# Patient Record
Sex: Female | Born: 1975 | Hispanic: No | Smoking: Never smoker
Health system: Southern US, Community
[De-identification: ages and names within clinical notes are randomized; demographics above are authoritative.]

## PROBLEM LIST (undated history)

## (undated) DIAGNOSIS — E119 Type 2 diabetes mellitus without complications: Secondary | ICD-10-CM

## (undated) HISTORY — DX: Type 2 diabetes mellitus without complications: E11.9

## (undated) HISTORY — PX: GALLBLADDER SURGERY: SHX652

---

## 2008-12-31 ENCOUNTER — Ambulatory Visit (HOSPITAL_COMMUNITY): Admission: RE | Admit: 2008-12-31 | Discharge: 2008-12-31 | Payer: Self-pay | Admitting: Obstetrics and Gynecology

## 2009-01-18 ENCOUNTER — Ambulatory Visit (HOSPITAL_COMMUNITY): Admission: RE | Admit: 2009-01-18 | Discharge: 2009-01-18 | Payer: Self-pay | Admitting: Obstetrics and Gynecology

## 2009-02-16 ENCOUNTER — Ambulatory Visit (HOSPITAL_COMMUNITY): Admission: RE | Admit: 2009-02-16 | Discharge: 2009-02-16 | Payer: Self-pay | Admitting: Obstetrics and Gynecology

## 2009-09-03 IMAGING — US US OB COMP +14 WK
1 series · 14 of 28 positions shown · non-contrast
Comparison: none

OBSTETRICAL ULTRASOUND:
 This ultrasound was performed in The [HOSPITAL], and the [facility] report will be stored to [REDACTED] PACS.

[Series 1: us ob comp +14 wk · 43 acquisitions, 14 frames shown]
[im 2/43]
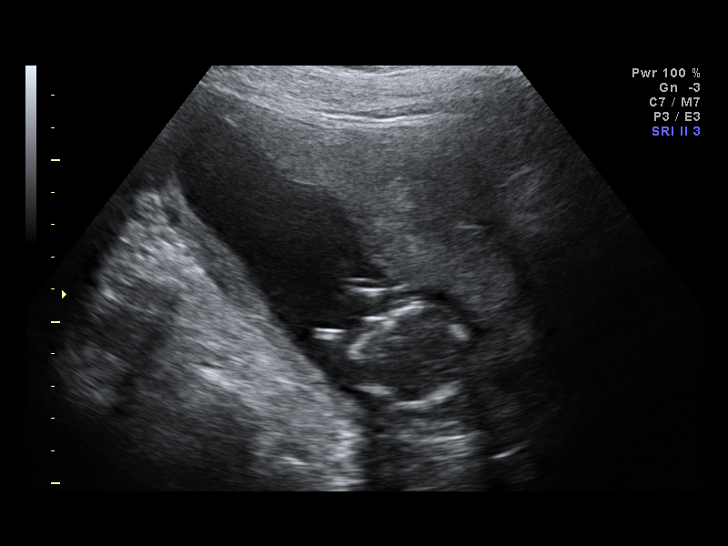
[im 5/43]
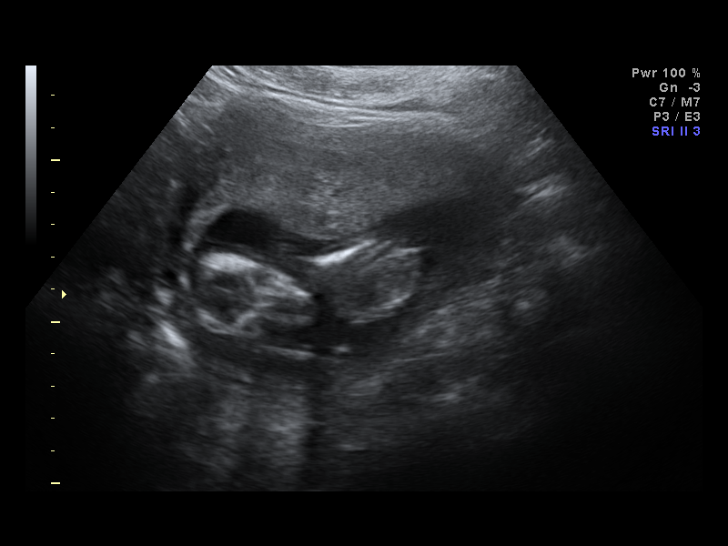
[im 8/43]
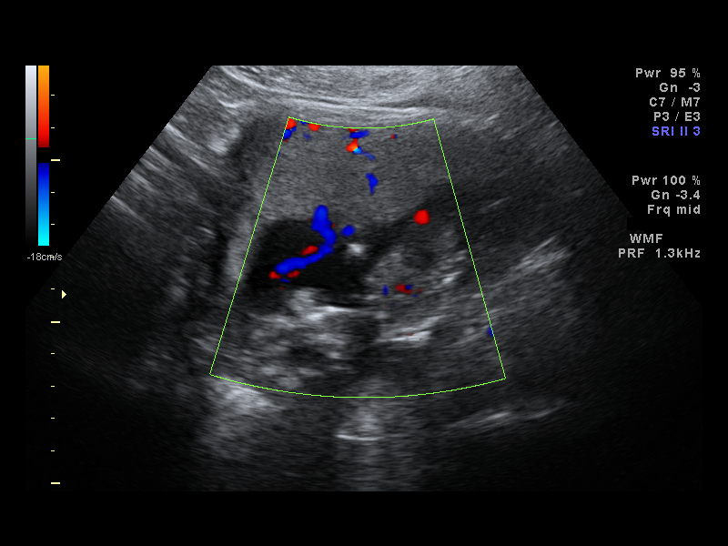
[im 11/43]
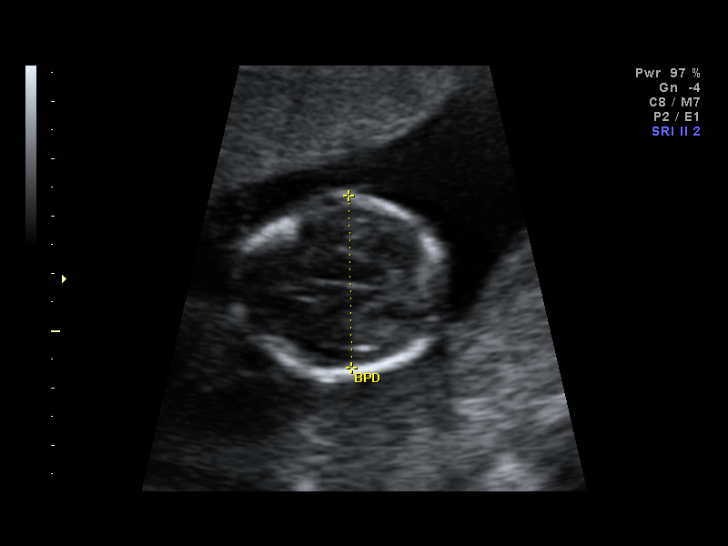
[im 15/43]
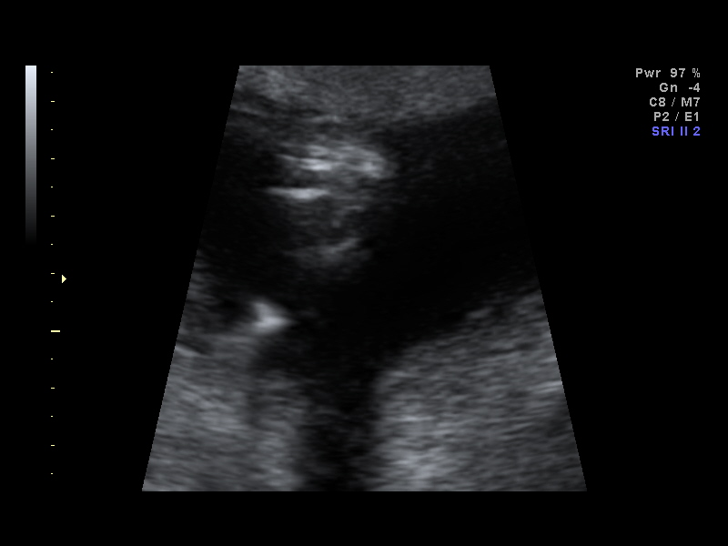
[im 18/43]
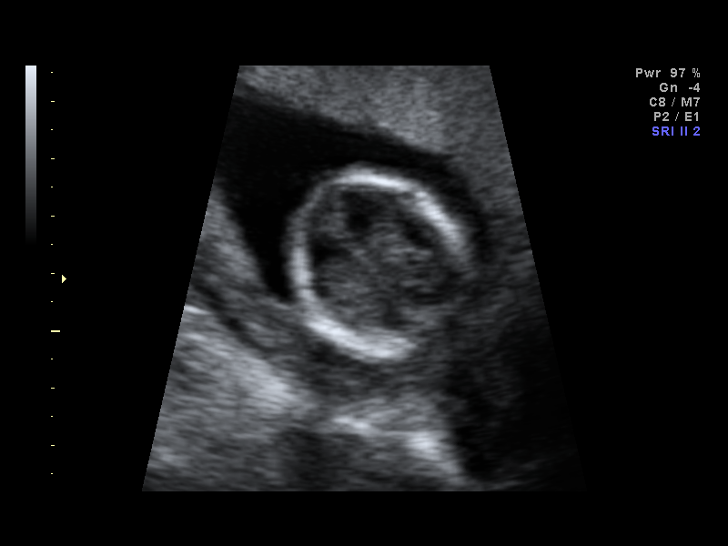
[im 21/43]
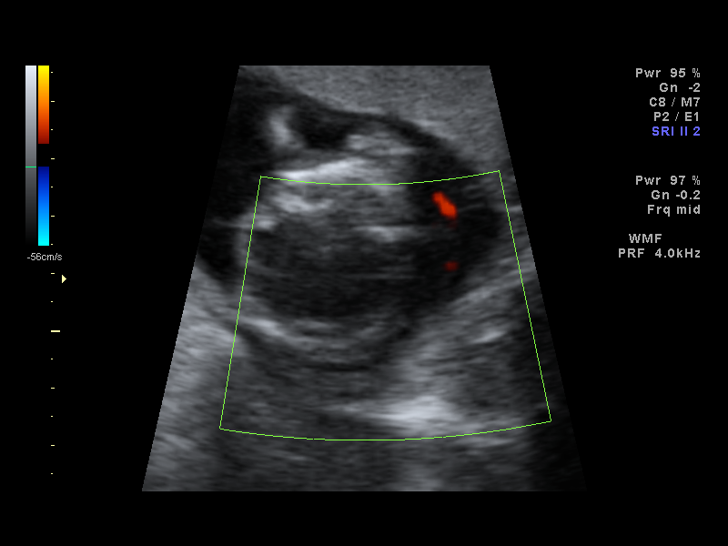
[im 24/43]
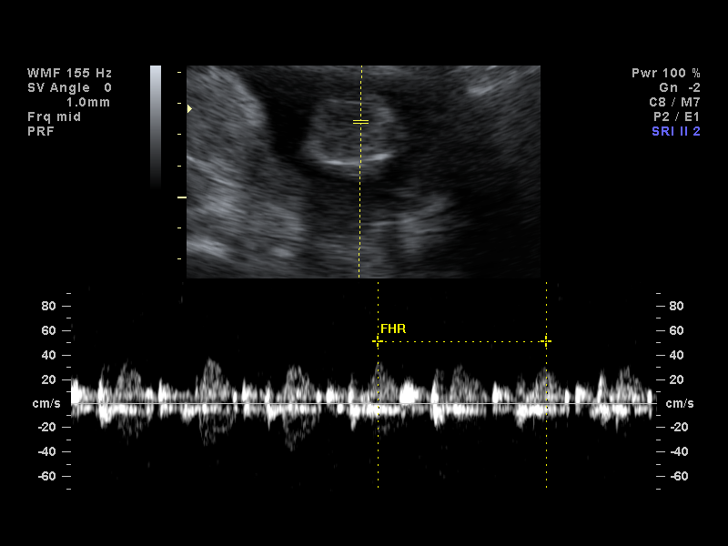
[im 27/43]
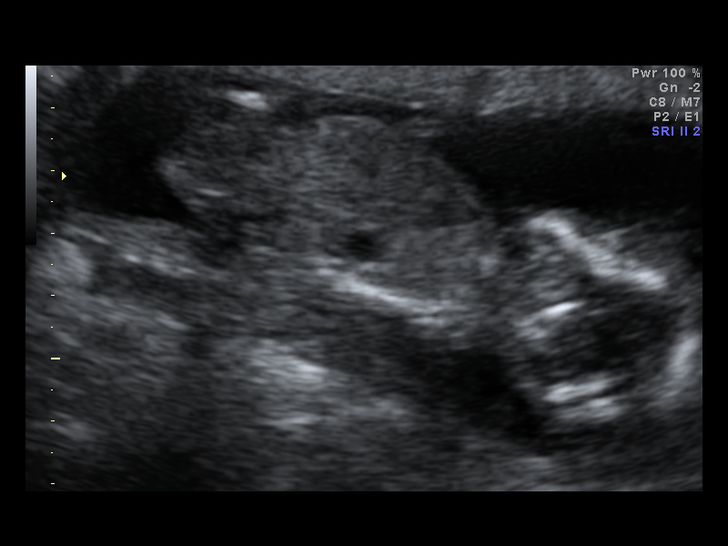
[im 30/43]
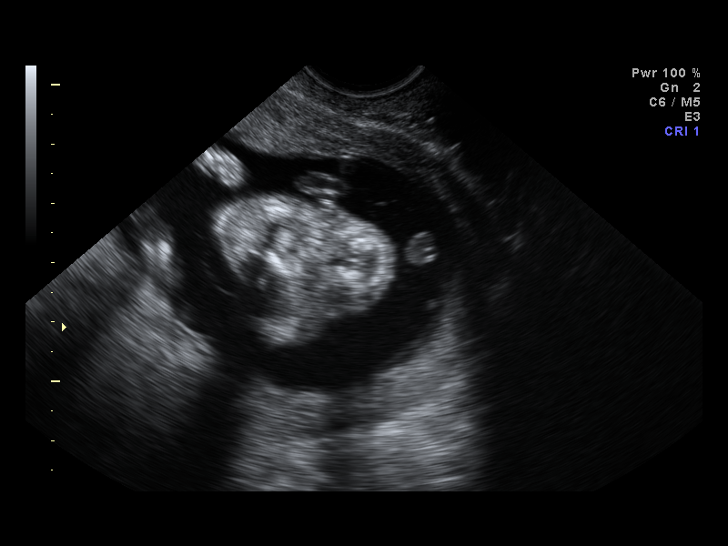
[im 33/43]
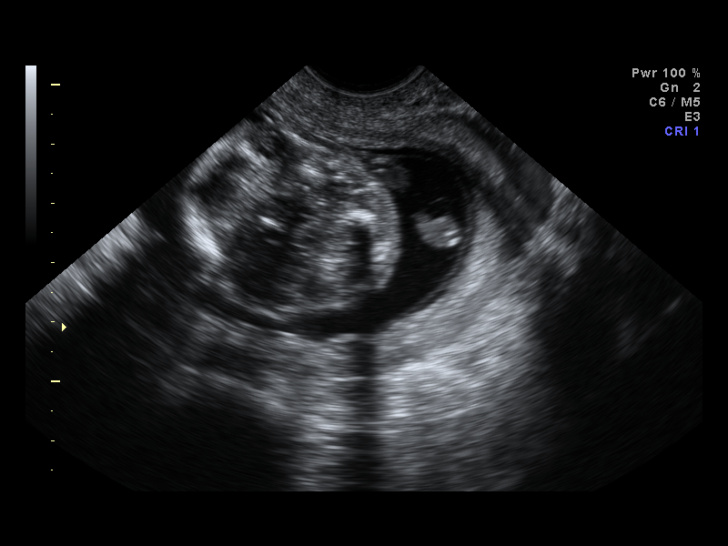
[im 36/43]
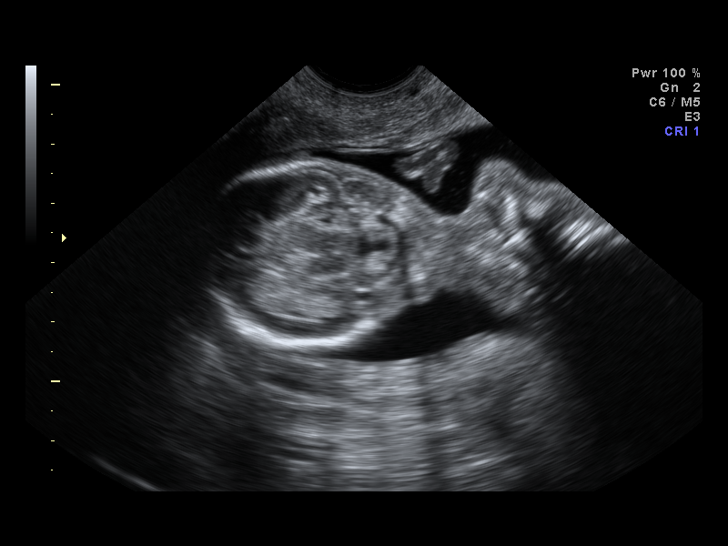
[im 39/43]
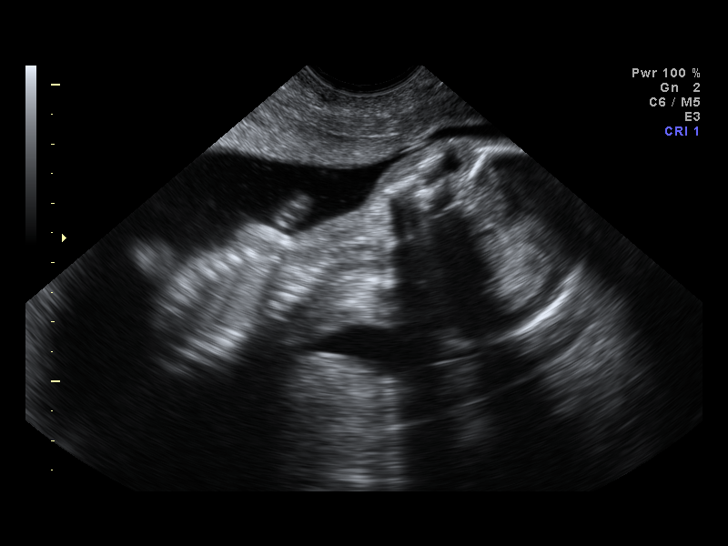
[im 43/43]
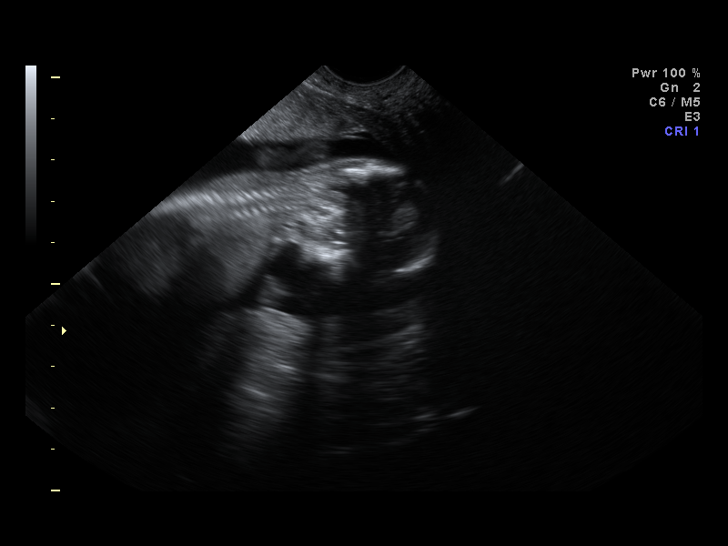

[14 of 28 positions shown; findings below may reference images not displayed]

IMPRESSION: [facility] has also been faxed to the ordering physician.

## 2009-09-21 IMAGING — US US OB DETAIL+14 WK
1 series · 18 of 28 positions shown · non-contrast
Comparison: none

OBSTETRICAL ULTRASOUND:
 This ultrasound was performed in The [HOSPITAL], and the [facility] report will be stored to [REDACTED] PACS.

[Series 1: us ob detail+14 wk · 48 acquisitions, 18 frames shown]
[im 1/48]
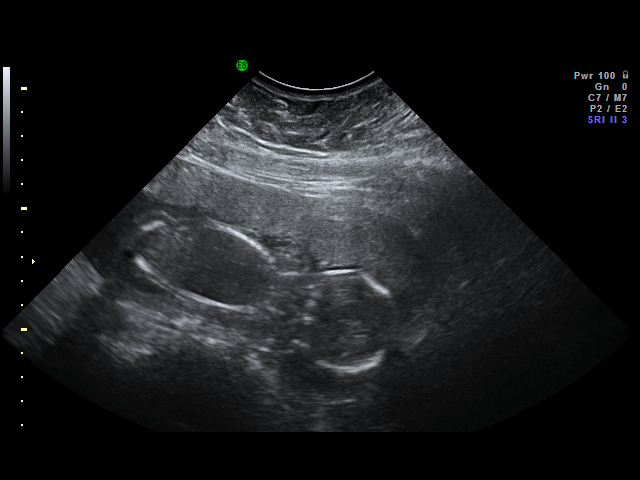
[im 4/48]
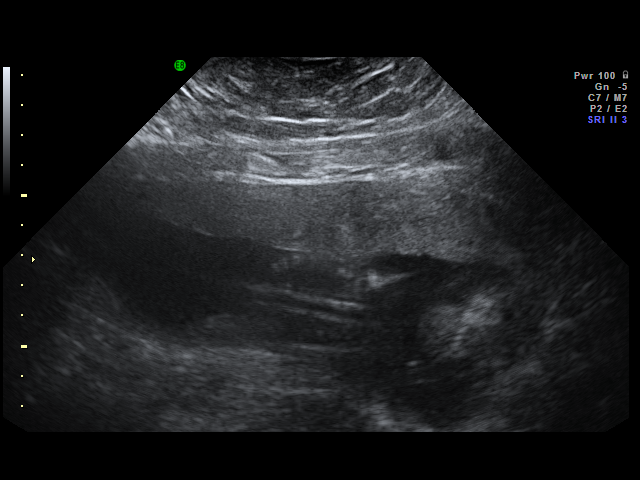
[im 6/48]
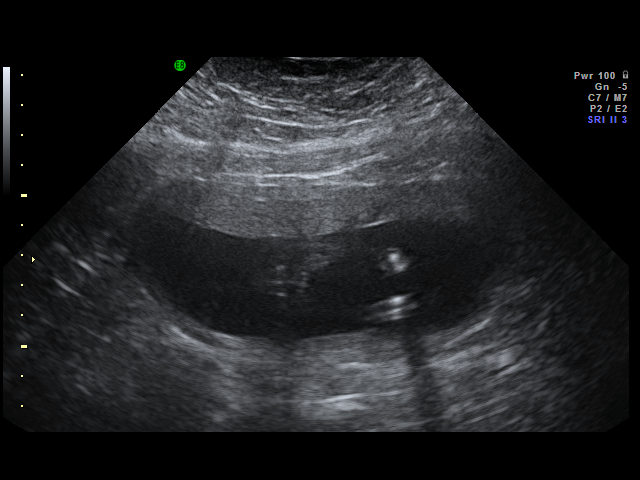
[im 9/48]
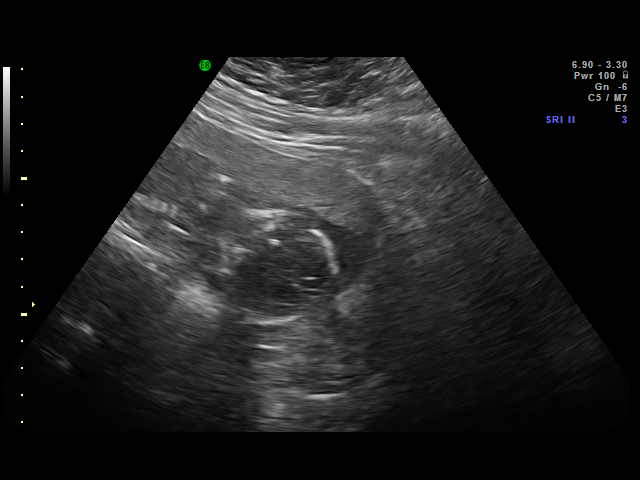
[im 13/48]
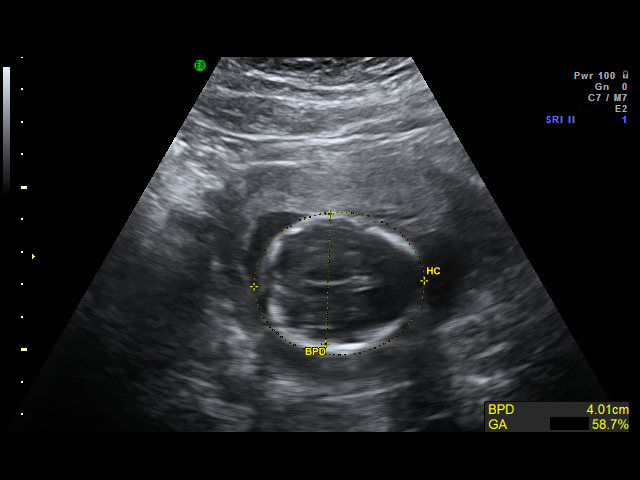
[im 14/48]
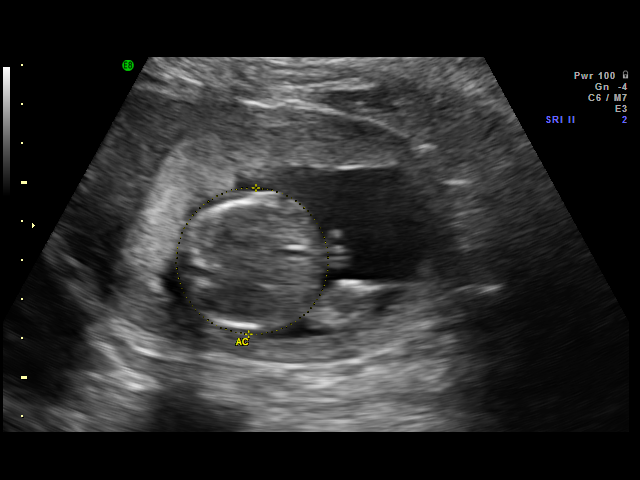
[im 18/48]
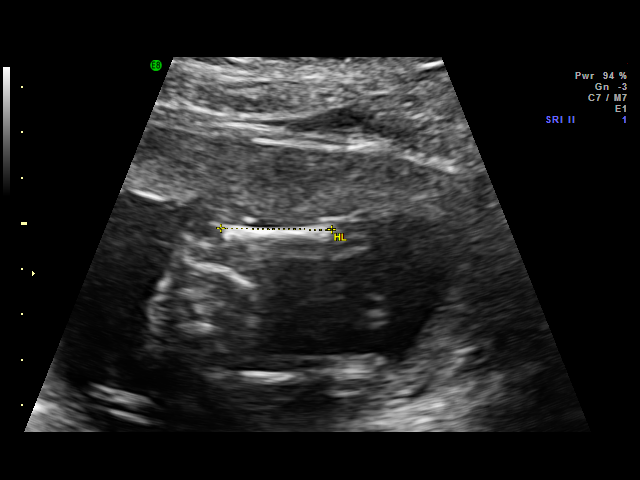
[im 20/48]
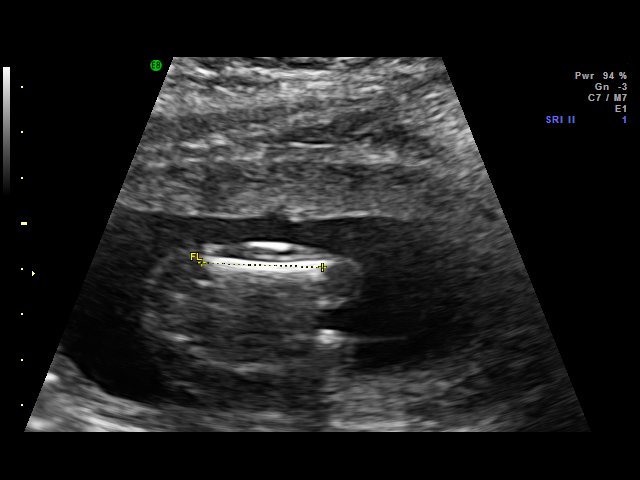
[im 23/48]
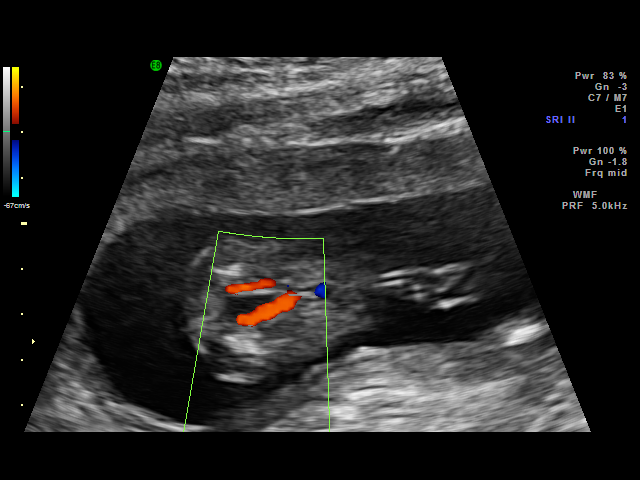
[im 25/48]
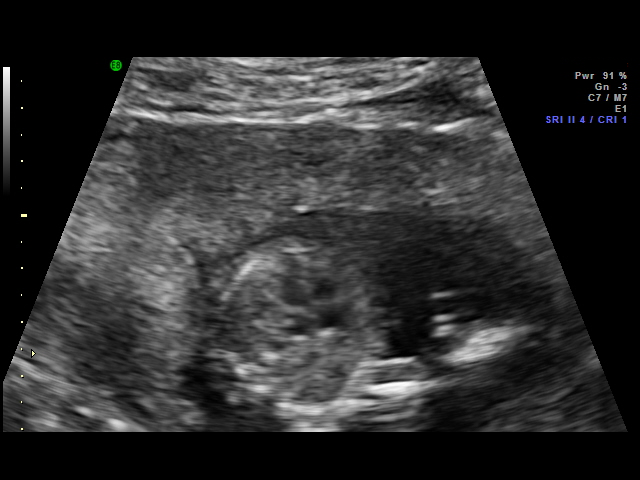
[im 28/48]
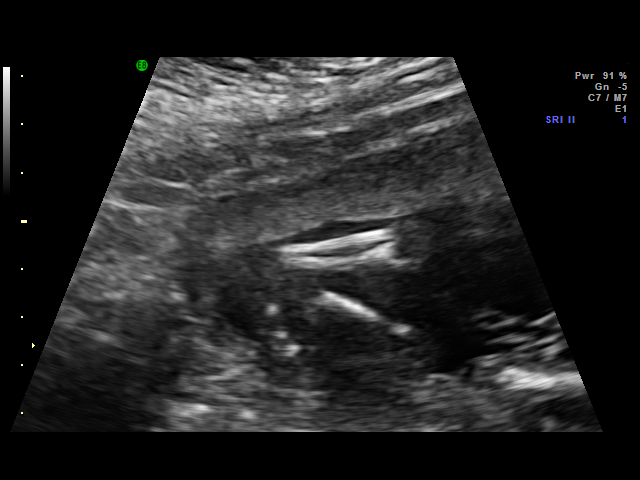
[im 30/48]
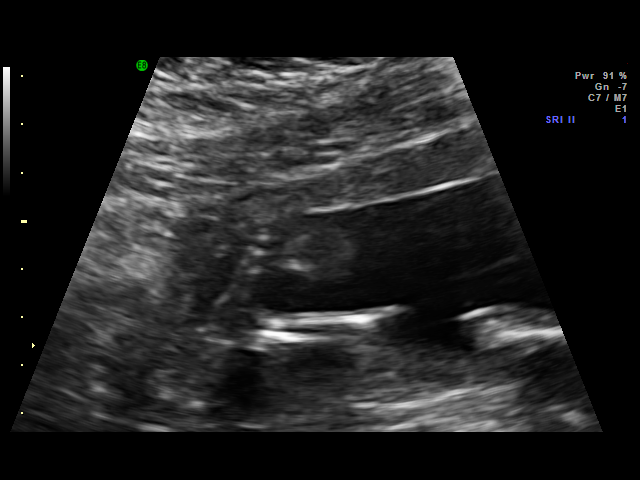
[im 34/48]
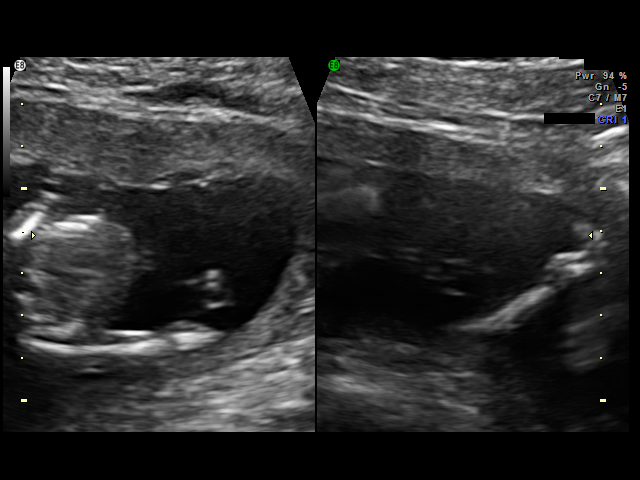
[im 37/48]
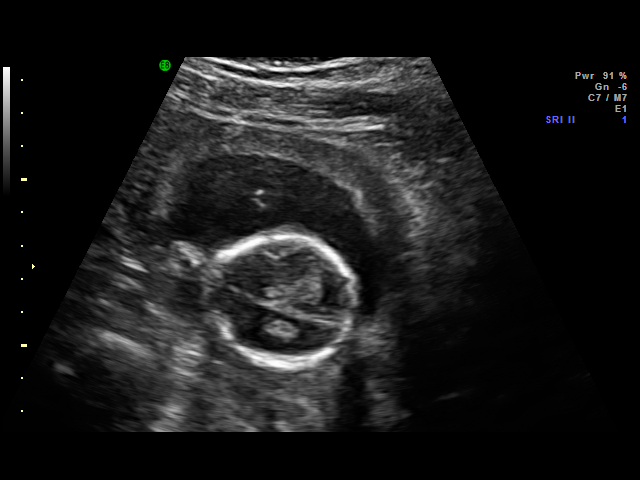
[im 39/48]
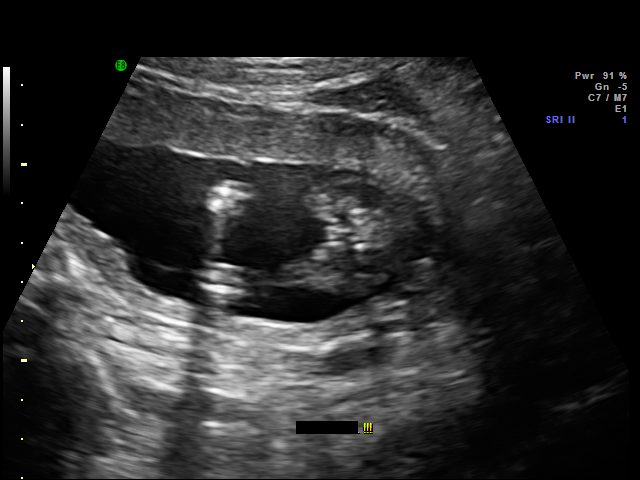
[im 42/48]
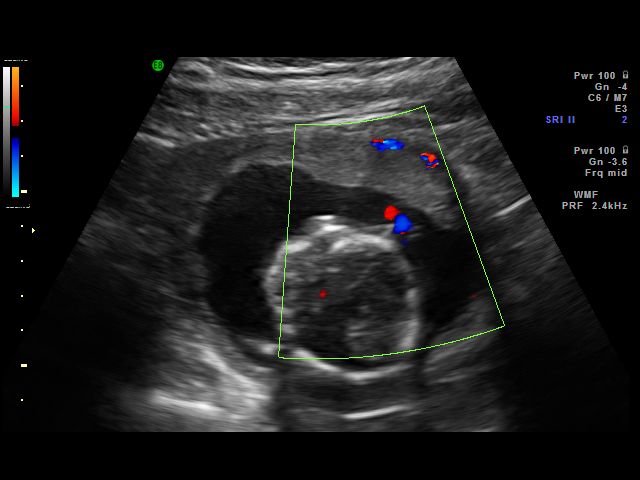
[im 44/48]
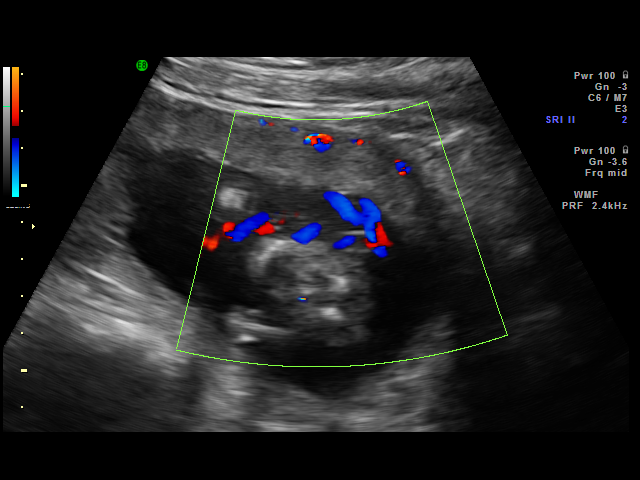
[im 48/48]
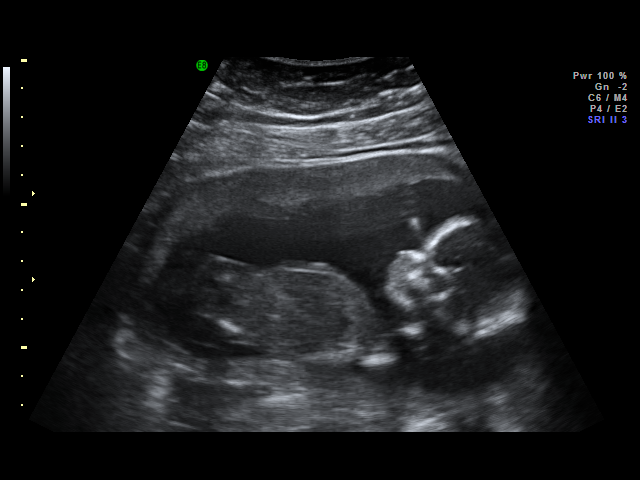

[18 of 28 positions shown; findings below may reference images not displayed]

IMPRESSION: [facility] has also been faxed to the ordering physician.

## 2009-10-20 IMAGING — US US OB FOLLOW-UP
1 series · 18 of 28 positions shown · non-contrast
Comparison: none

OBSTETRICAL ULTRASOUND:
 This ultrasound was performed in The [HOSPITAL], and the [facility] report will be stored to [REDACTED] PACS.

[Series 1: us ob follow-up · 18 of 46 slices shown]
[im 1/46]
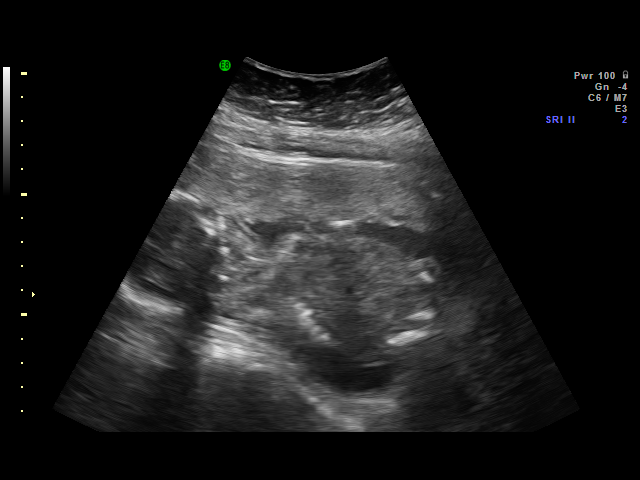
[im 4/46]
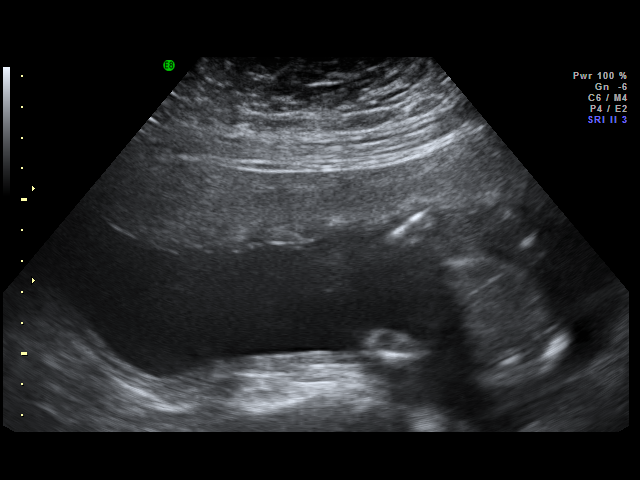
[im 6/46]
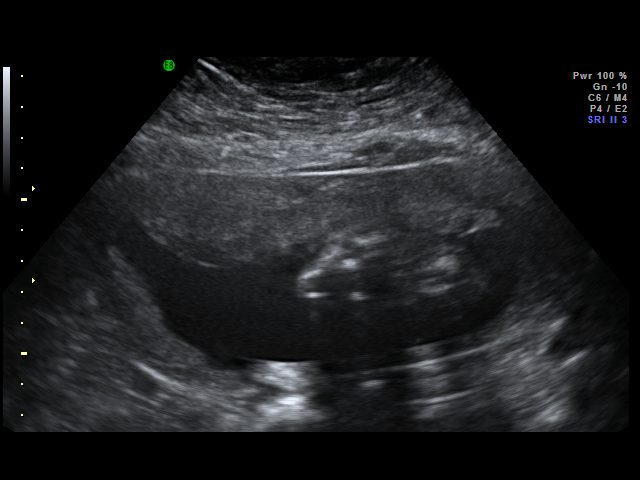
[im 9/46]
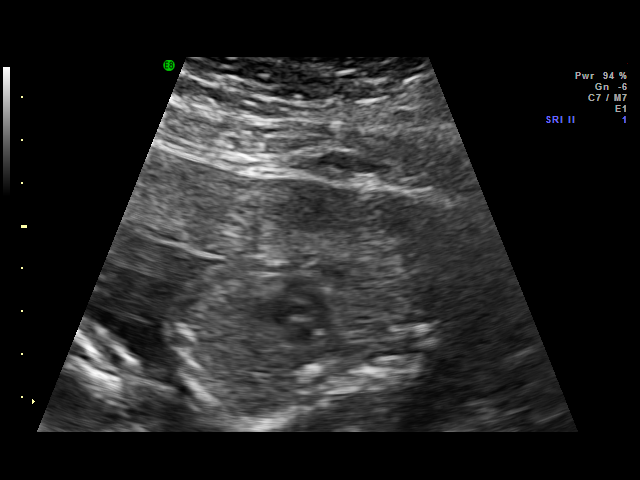
[im 12/46]
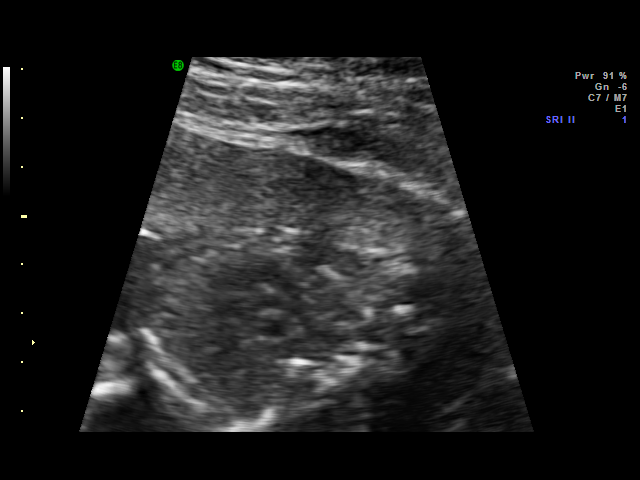
[im 14/46]
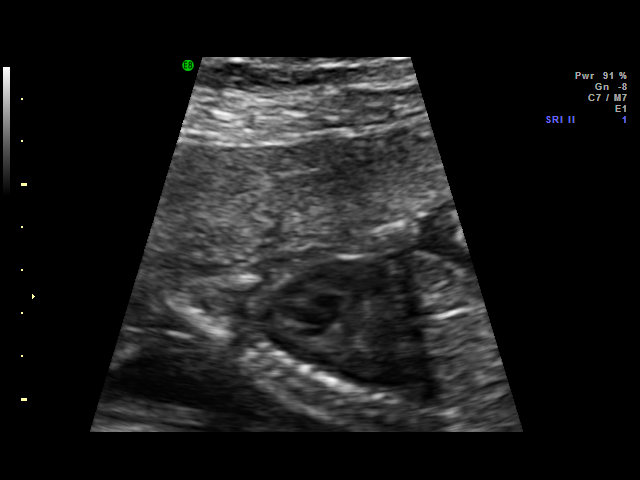
[im 17/46]
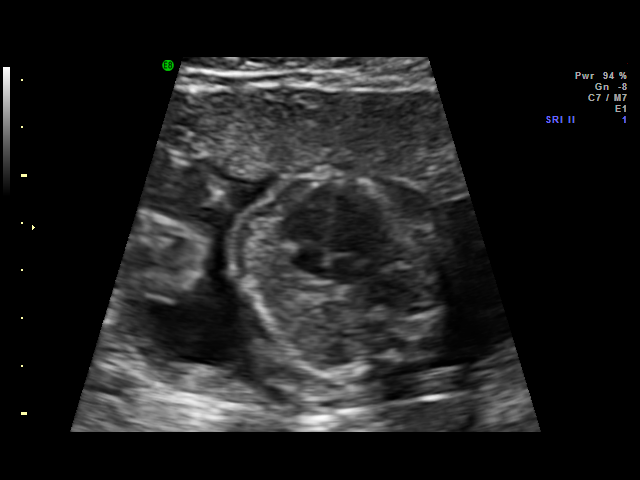
[im 19/46]
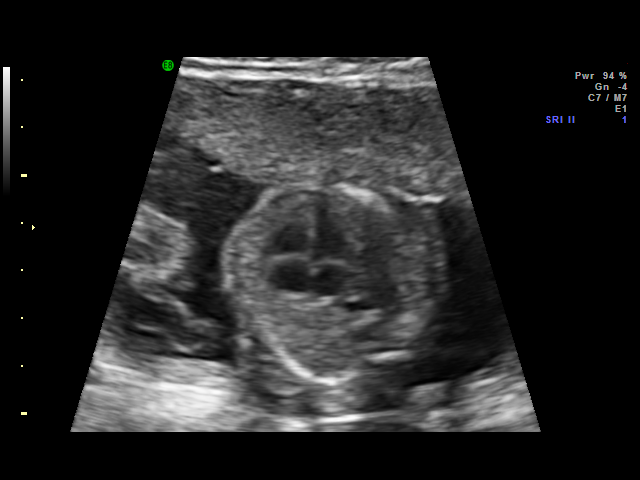
[im 22/46]
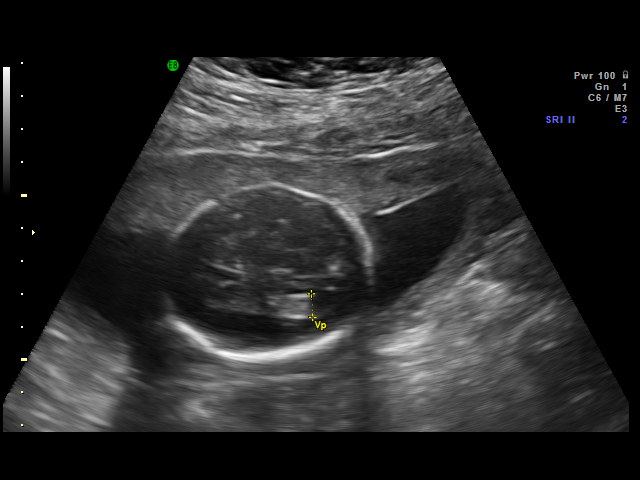
[im 24/46]
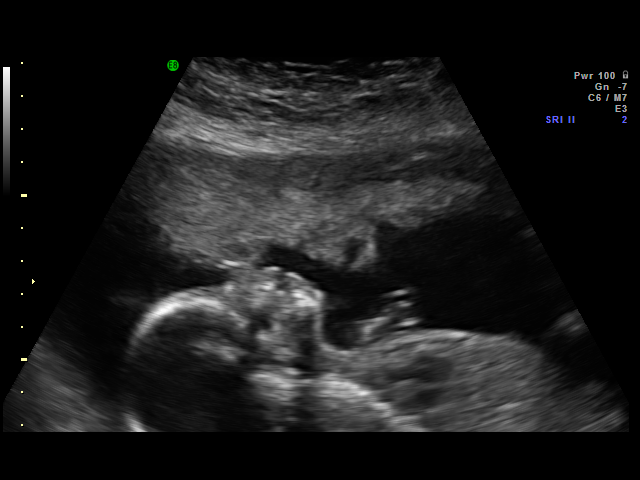
[im 27/46]
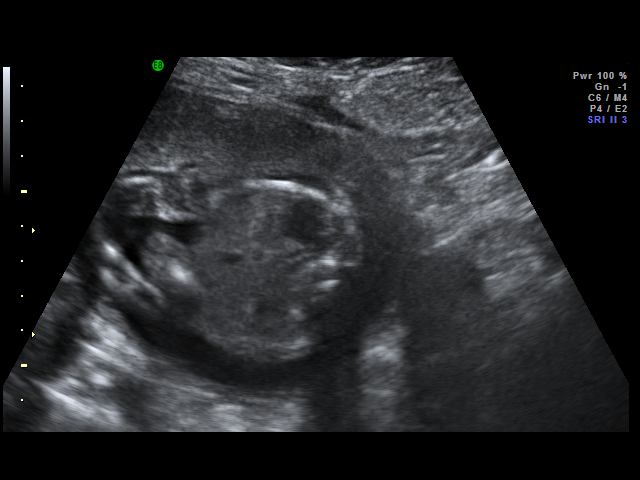
[im 29/46]
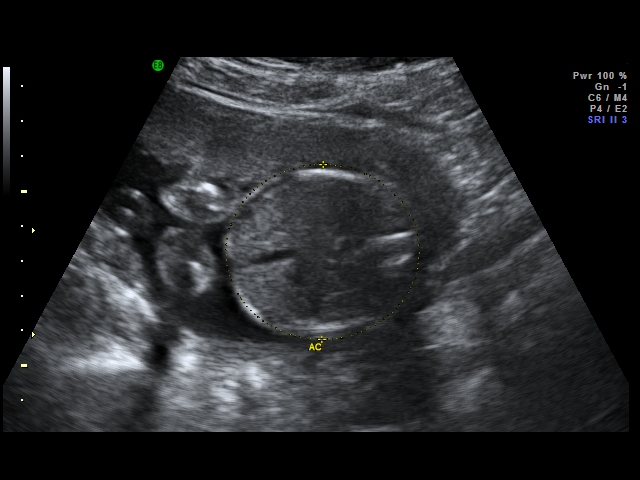
[im 32/46]
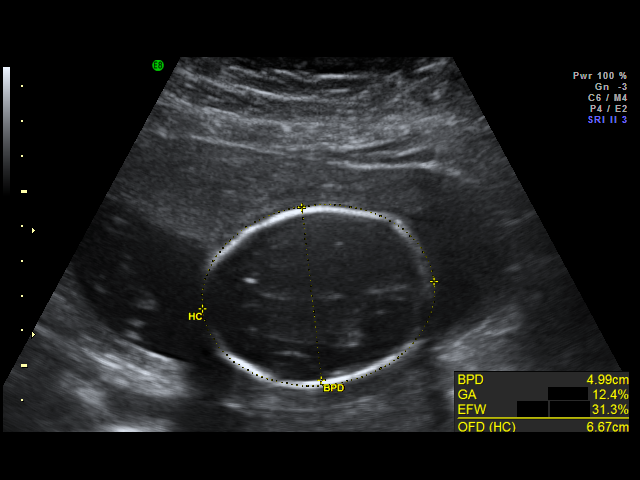
[im 36/46]
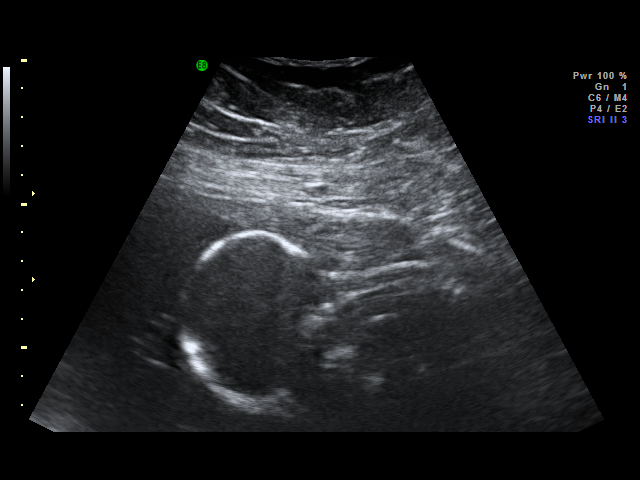
[im 37/46]
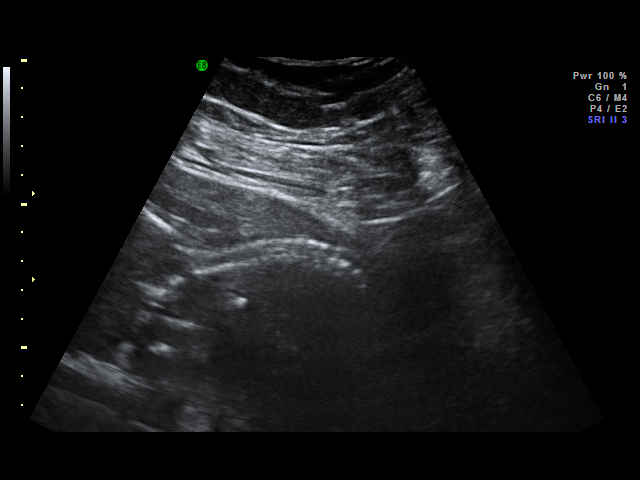
[im 41/46]
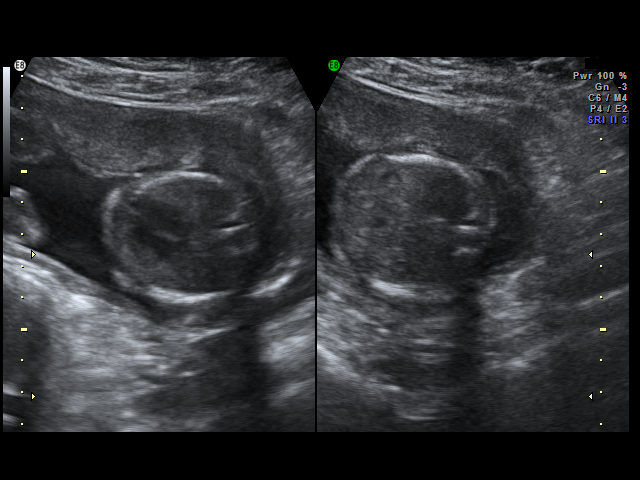
[im 42/46]
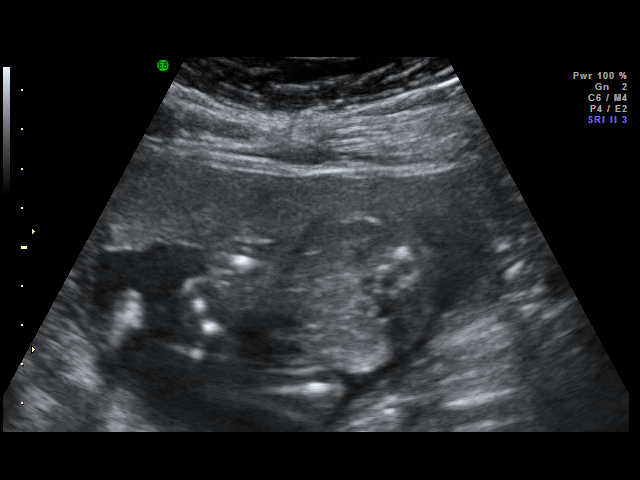
[im 46/46]
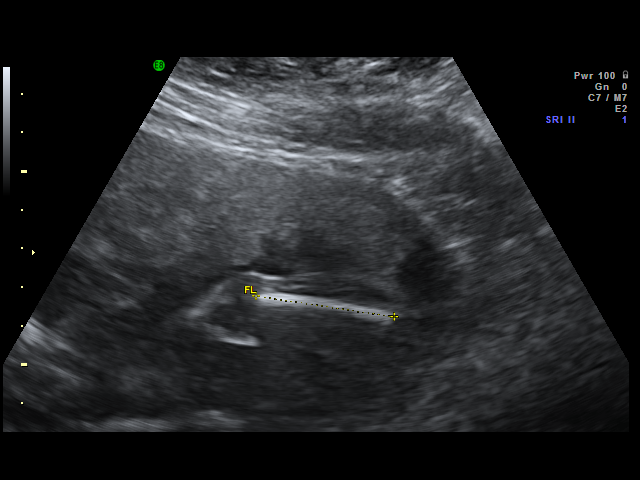

[18 of 28 positions shown; findings below may reference images not displayed]

IMPRESSION: [facility] has also been faxed to the ordering physician.

## 2017-12-26 ENCOUNTER — Other Ambulatory Visit (INDEPENDENT_AMBULATORY_CARE_PROVIDER_SITE_OTHER): Payer: Self-pay | Admitting: Radiology

## 2017-12-26 ENCOUNTER — Ambulatory Visit (INDEPENDENT_AMBULATORY_CARE_PROVIDER_SITE_OTHER): Payer: 59 | Admitting: Orthopaedic Surgery

## 2017-12-26 ENCOUNTER — Encounter (INDEPENDENT_AMBULATORY_CARE_PROVIDER_SITE_OTHER): Payer: Self-pay | Admitting: Orthopaedic Surgery

## 2017-12-26 VITALS — BP 105/69 | HR 87 | Ht 64.0 in | Wt 206.0 lb

## 2017-12-26 DIAGNOSIS — M25552 Pain in left hip: Secondary | ICD-10-CM

## 2017-12-26 MED ORDER — METHYLPREDNISOLONE ACETATE 40 MG/ML IJ SUSP
80.0000 mg | INTRAMUSCULAR | Status: AC | PRN
  Administered 2017-12-26: 80 mg

## 2017-12-26 MED ORDER — DICLOFENAC SODIUM 1 % TD GEL: TRANSDERMAL | 3 refills | Status: AC

## 2017-12-26 MED ORDER — LIDOCAINE HCL 1 % IJ SOLN
2.0000 mL | INTRAMUSCULAR | Status: AC | PRN
  Administered 2017-12-26: 2 mL

## 2017-12-26 MED ORDER — BUPIVACAINE HCL 0.5 % IJ SOLN
2.0000 mL | INTRAMUSCULAR | Status: AC | PRN
  Administered 2017-12-26: 2 mL via INTRA_ARTICULAR

## 2017-12-26 NOTE — Progress Notes (Signed)
Office Visit Note   Patient: Misty HarrisJennifer Montgomery           Date of Birth: 04/24/1976           MRN: [id number] Visit Date: 12/26/2017              Requested by: No referring provider defined for this encounter. PCP: No primary care provider on file.   Assessment & Plan: Visit Diagnoses:  1. Pain of left hip joint     Plan: Pain over lateral aspect left hip.  Several diagnostic possibilities.  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  Studies prior to that time were negative.  She has had a number of courses of physical therapy and medicines.  Still having quite a bit of pain.  After long discussion she like to try another shot of cortisone.  I think it might be worthwhile to consider an evaluation [facility] for consideration of arthroscopy if this is not helpful  Follow-Up Instructions: Return in about 1 month (around 01/26/2018).   Orders:  Orders Placed This Encounter  Procedures  . Large Joint Inj: L greater trochanter   No orders of the defined types were placed in this encounter.     Procedures: Large Joint Inj: L greater trochanter on 12/26/2017 11:39 AM Indications: pain and diagnostic evaluation Details: 22 G 3.5 in needle, lateral approach  Arthrogram: No  Medications: 2 mL lidocaine 1 %; 2 mL bupivacaine 0.5 %; 80 mg methylPREDNISolone acetate 40 MG/ML Outcome: tolerated well, no immediate complications Procedure, treatment alternatives, risks and benefits explained, specific risks discussed. Consent was given by the patient. Immediately prior to procedure a time out was called to verify the correct patient, procedure, equipment, support staff and site/side marked as required. Patient was prepped and draped in the usual sterile fashion.       Clinical Data: No additional findings.   Subjective: Chief Complaint  Patient presents with  . New Patient  (Initial Visit)    L HIP PAIN FOR 6 YRS GETTING WORSE, NO INJURY OR SURGERIES LAST MRI WAS 08/26/15  [name] was evaluated for left hip pain several years ago.  I performed an MRI scan of the lumbar spine and pelvis both of which were nondiagnostic.  She is had a cortisone injection by me over the greater trochanter which provided several weeks of relief.  Since that time she has had recurrent pain.  She had a follow-up MRI scan of her pelvis in [place] in 2017 that demonstrated mild greater trochanteric bursitis.  Pain started after the birth of her child 6 years ago and as part of her treatment had several courses of physical therapy, Motrin, ice and a cortisone injection.  Her pain is localized greater trochanter.  Numbness or tingling.  HPI  Review of Systems  Constitutional: Negative for fatigue and fever.  HENT: Negative for ear pain.   Eyes: Negative for pain.  Respiratory: Negative for cough and shortness of breath.   Cardiovascular: Negative for leg swelling.  Gastrointestinal: Negative for constipation and diarrhea.  Genitourinary: Negative for difficulty urinating.  Musculoskeletal: Positive for back pain. Negative for neck pain.  Skin: Negative for rash.  Allergic/Immunologic: Negative for food allergies.  Neurological: Positive for weakness. Negative for numbness.  Hematological: Does not bruise/bleed easily.  Psychiatric/Behavioral: Positive for sleep disturbance.     Objective: Vital Signs: BP 105/69 (  BP Location: Right Arm, Patient Position: Sitting, Cuff Size: Normal)   Pulse 87   Ht 5\' 4"  (1.626 m)   Wt 206 lb (93.4 kg)   BMI 35.36 kg/m   Physical Exam  Constitutional: She is oriented to person, place, and time. She appears well-developed and well-nourished.  HENT:  Mouth/Throat: Oropharynx is clear and moist.  Eyes: Pupils are equal, round, and reactive to light. EOM are normal.  Pulmonary/Chest: Effort normal.  Neurological: She is alert and oriented to  person, place, and time.  Skin: Skin is warm and dry.  Psychiatric: She has a normal mood and affect. Her behavior is normal.    Ortho Exam awake alert and oriented x3.  Comfortable sitting.  Pain left hip is localized directly over the greater trochanter.  Skin intact.  No pain with range of motion of either hip.  Straight leg raise negative.  No distal edema. Specialty Comments:  No specialty comments available.  Imaging: No results found.   PMFS History: There are no active problems to display for this patient.  Past Medical History:  Diagnosis Date  . Diabetes (HCC)     History reviewed. No pertinent family history.  Past Surgical History:  Procedure Laterality Date  . GALLBLADDER SURGERY     Social History   Occupational History  . Not on file  Tobacco Use  . Smoking status: Never Smoker  . Smokeless tobacco: Never Used  Substance and Sexual Activity  . Alcohol use: Yes    Comment: RARE  . Drug use: Not Currently  . Sexual activity: Not on file

## 2018-01-16 ENCOUNTER — Other Ambulatory Visit (INDEPENDENT_AMBULATORY_CARE_PROVIDER_SITE_OTHER): Payer: Self-pay | Admitting: Radiology

## 2018-01-16 DIAGNOSIS — M25552 Pain in left hip: Secondary | ICD-10-CM
